# Patient Record
Sex: Female | Born: 1990 | Race: White | Hispanic: No | Marital: Single | Smoking: Never smoker
Health system: Southern US, Community
[De-identification: ages and names within clinical notes are randomized; demographics above are authoritative.]

## PROBLEM LIST (undated history)

## (undated) DIAGNOSIS — F502 Bulimia nervosa, unspecified: Secondary | ICD-10-CM

## (undated) DIAGNOSIS — N83201 Unspecified ovarian cyst, right side: Secondary | ICD-10-CM

## (undated) DIAGNOSIS — K5900 Constipation, unspecified: Secondary | ICD-10-CM

## (undated) DIAGNOSIS — N83202 Unspecified ovarian cyst, left side: Secondary | ICD-10-CM

## (undated) DIAGNOSIS — R630 Anorexia: Secondary | ICD-10-CM

## (undated) HISTORY — PX: WISDOM TOOTH EXTRACTION: SHX21

---

## 2012-01-11 ENCOUNTER — Emergency Department (HOSPITAL_COMMUNITY)
Admission: EM | Admit: 2012-01-11 | Discharge: 2012-01-12 | Disposition: A | Discharge status: Home / Self Care | Payer: BC Managed Care – PPO | Attending: Emergency Medicine | Admitting: Emergency Medicine

## 2012-01-11 ENCOUNTER — Encounter (HOSPITAL_COMMUNITY): Payer: Self-pay

## 2012-01-11 DIAGNOSIS — N920 Excessive and frequent menstruation with regular cycle: Secondary | ICD-10-CM | POA: Insufficient documentation

## 2012-01-11 DIAGNOSIS — Z881 Allergy status to other antibiotic agents status: Secondary | ICD-10-CM | POA: Insufficient documentation

## 2012-01-11 HISTORY — DX: Bulimia nervosa: F50.2

## 2012-01-11 HISTORY — DX: Unspecified ovarian cyst, right side: N83.201

## 2012-01-11 HISTORY — DX: Unspecified ovarian cyst, left side: N83.202

## 2012-01-11 HISTORY — DX: Anorexia: R63.0

## 2012-01-11 HISTORY — DX: Constipation, unspecified: K59.00

## 2012-01-11 HISTORY — DX: Bulimia nervosa, unspecified: F50.20

## 2012-01-11 LAB — COMPREHENSIVE METABOLIC PANEL
ALT: 12 U/L (ref 0–35)
BUN: 14 mg/dL (ref 6–23)
CO2: 27 mEq/L (ref 19–32)
Calcium: 9.4 mg/dL (ref 8.4–10.5)
Creatinine, Ser: 0.77 mg/dL (ref 0.50–1.10)
GFR calc Af Amer: >90 mL/min (ref >90)
GFR calc non Af Amer: >90 mL/min (ref >90)
Glucose, Bld: 101 mg/dL — ABNORMAL HIGH (ref 70–99)
Sodium: 139 mEq/L (ref 135–145)
Total Protein: 6.6 g/dL (ref 6.0–8.3)

## 2012-01-11 LAB — CBC WITH DIFFERENTIAL/PLATELET
Eosinophils Absolute: 0.1 10*3/uL (ref 0.0–0.7)
Eosinophils Relative: 2 % (ref 0–5)
HCT: 38.3 % (ref 36.0–46.0)
Lymphocytes Relative: 28 % (ref 12–46)
Lymphs Abs: 2.4 10*3/uL (ref 0.7–4.0)
MCH: 29.6 pg (ref 26.0–34.0)
MCV: 86.7 fL (ref 78.0–100.0)
Monocytes Absolute: 0.8 10*3/uL (ref 0.1–1.0)
Monocytes Relative: 9 % (ref 3–12)
Platelets: 314 10*3/uL (ref 150–400)
RBC: 4.42 MIL/uL (ref 3.87–5.11)
WBC: 8.4 10*3/uL (ref 4.0–10.5)

## 2012-01-11 LAB — URINALYSIS, ROUTINE W REFLEX MICROSCOPIC
Bilirubin Urine: NEGATIVE
Leukocytes, UA: NEGATIVE
Nitrite: NEGATIVE
Specific Gravity, Urine: 1.023 (ref 1.005–1.030)
Urobilinogen, UA: 1 mg/dL (ref 0.0–1.0)
pH: 7 (ref 5.0–8.0)

## 2012-01-11 LAB — WET PREP, GENITAL: Yeast Wet Prep HPF POC: NONE SEEN

## 2012-01-11 NOTE — ED Notes (Signed)
Pt presents with NAD- vaginal bleeding x 2 days LMP 2 weeks ago- denies urinary symptoms, N/V/D and fever

## 2012-01-11 NOTE — ED Provider Notes (Signed)
History     CSN: [id number]  Arrival date & time 01/11/12  2122   First MD Initiated Contact with Patient 01/11/12 2217      Chief Complaint  Patient presents with  . Vaginal Bleeding  . Abdominal Pain    (Consider location/radiation/quality/duration/timing/severity/associated sxs/prior treatment) HPI Comments: Patient with history of ovarian cysts presents with vaginal bleeding of 3 days duration. The bleeding is heavy, soaking through a super tampon every 1-2 hours. She has associated cramps and sharp pains on the LLQ of the abdomen. It radiates to her back. Progression is slowly worsening. She has taken diclofenac to relieve the pain which has worked. This afternoon after work she felt light-headed and nauseated. Denies fever, vomiting, diarrhea, dysuria, or vaginal discharge. LMP was 2 weeks ago. Her normal cycle is every 28 days. She had unprotected sex 2 weeks ago.   Patient is a 21 y.o. female presenting with vaginal bleeding and abdominal pain. The history is provided by the patient.  Vaginal Bleeding Associated symptoms include abdominal pain and nausea. Pertinent negatives include no congestion, coughing, diaphoresis, fever, headaches, myalgias, neck pain or vomiting.  Abdominal Pain The primary symptoms of the illness include abdominal pain, nausea and vaginal bleeding. The primary symptoms of the illness do not include fever, vomiting, diarrhea, dysuria or vaginal discharge.  Symptoms associated with the illness do not include diaphoresis.    Past Medical History  Diagnosis Date  . Bilateral ovarian cysts   . Constipation   . Anorexia   . Bulimia     Past Surgical History  Procedure Date  . Wisdom tooth extraction     No family history on file.  History  Substance Use Topics  . Smoking status: Never Smoker   . Smokeless tobacco: Not on file  . Alcohol Use: Yes    OB History    Grav Para Term Preterm Abortions TAB SAB Ect Mult Living                   Review of Systems  Constitutional: Negative for fever, diaphoresis and activity change.  HENT: Negative for congestion and neck pain.   Respiratory: Negative for cough.   Gastrointestinal: Positive for nausea and abdominal pain. Negative for vomiting and diarrhea.  Genitourinary: Positive for vaginal bleeding. Negative for dysuria, flank pain and vaginal discharge.  Musculoskeletal: Negative for myalgias.  Skin: Negative for color change and wound.  Neurological: Positive for light-headedness. Negative for headaches.  All other systems reviewed and are negative.    Allergies  Minocycline  Home Medications   Current Outpatient Rx  Name Route Sig Dispense Refill  . ALPRAZOLAM 0.5 MG PO TABS Oral Take 0.5 mg by mouth at bedtime as needed. Anxiety    . DICLOFENAC SODIUM 25 MG PO TBEC Oral Take 25 mg by mouth as needed. Anxiety    . ESCITALOPRAM OXALATE 20 MG PO TABS Oral Take 20 mg by mouth daily.    Kathrynn Running ESTRADIOL-FE 0.8-25 MG-MCG PO CHEW Oral Chew 1 tablet by mouth daily.      BP 116/76  Pulse 79  Temp 97.6 F (36.4 C) (Oral)  Resp 23  Ht 5\' 9"  (1.753 m)  Wt 134 lb (60.782 kg)  BMI 19.79 kg/m2  SpO2 98%  LMP 12/24/2011  Physical Exam  Nursing note and vitals reviewed. Constitutional: She is oriented to person, place, and time. She appears well-developed and well-nourished. No distress.       Sitting comfortably and laughing with friends  HENT:  Head: Normocephalic and atraumatic.  Eyes: Conjunctivae normal and EOM are normal.  Neck: Normal range of motion.  Pulmonary/Chest: Effort normal.  Abdominal: Soft. Bowel sounds are normal.       Nl bowel sounds. Abdomen soft with mild tenderness to palpation over the LLQ. No peritoneal signs.  Genitourinary:       No CMT. Mild left adnexal tenderness, no mass. No right adnexal tenderness. Blood seen in vaginal vault. No vaginal discharge. Cervical os closed.   Musculoskeletal: Normal range of motion.   Neurological: She is alert and oriented to person, place, and time.  Skin: Skin is warm and dry. No rash noted. She is not diaphoretic.  Psychiatric: She has a normal mood and affect. Her behavior is normal.    ED Course  Procedures (including critical care time)   Labs Reviewed  CBC WITH DIFFERENTIAL  COMPREHENSIVE METABOLIC PANEL  URINALYSIS, ROUTINE W REFLEX MICROSCOPIC  PREGNANCY, URINE   No results found.   No diagnosis found.    MDM  Menorrhagia 21 year old female presents emergency department complaining of heavy vaginal bleeding.  Labs reviewed and no evidence of anemia found.  Pelvic exam non-concerning for PID or ovarian torsion.  Patient has been established OB/GYN doctor that she can followup with tomorrow morning.  Patient appears reliable for followup.` At this time there does not appear to be any evidence of an acute emergency medical condition and the patient appears stable for discharge with appropriate outpatient follow up. Diagnosis was discussed with patient who verbalizes understanding and is agreeable to discharge.         Jaci Carrel, [place] 01/12/12 0005

## 2012-01-12 LAB — GC/CHLAMYDIA PROBE AMP, GENITAL
Chlamydia, DNA Probe: NEGATIVE
GC Probe Amp, Genital: NEGATIVE

## 2012-01-12 MED ORDER — HYDROCODONE-ACETAMINOPHEN 5-325 MG PO TABS: 1.0000 | ORAL_TABLET | Freq: Once | ORAL | Status: DC

## 2012-01-12 NOTE — ED Provider Notes (Signed)
Medical screening examination/treatment/procedure(s) were performed by non-physician practitioner and as supervising physician I was immediately available for consultation/collaboration.  Danniel Tones M Bryleigh Ottaway, MD 01/12/12 0550 

## 2013-07-05 ENCOUNTER — Emergency Department (HOSPITAL_COMMUNITY): Payer: BC Managed Care – PPO

## 2013-07-05 ENCOUNTER — Encounter (HOSPITAL_COMMUNITY): Payer: Self-pay | Admitting: Emergency Medicine

## 2013-07-05 ENCOUNTER — Emergency Department (HOSPITAL_COMMUNITY)
Admission: EM | Admit: 2013-07-05 | Discharge: 2013-07-05 | Disposition: A | Discharge status: Home / Self Care | Payer: BC Managed Care – PPO | Attending: Emergency Medicine | Admitting: Emergency Medicine

## 2013-07-05 DIAGNOSIS — Z8659 Personal history of other mental and behavioral disorders: Secondary | ICD-10-CM | POA: Insufficient documentation

## 2013-07-05 DIAGNOSIS — N949 Unspecified condition associated with female genital organs and menstrual cycle: Secondary | ICD-10-CM | POA: Insufficient documentation

## 2013-07-05 DIAGNOSIS — Z79899 Other long term (current) drug therapy: Secondary | ICD-10-CM | POA: Insufficient documentation

## 2013-07-05 DIAGNOSIS — R1110 Vomiting, unspecified: Secondary | ICD-10-CM | POA: Insufficient documentation

## 2013-07-05 DIAGNOSIS — Z8719 Personal history of other diseases of the digestive system: Secondary | ICD-10-CM | POA: Insufficient documentation

## 2013-07-05 DIAGNOSIS — R102 Pelvic and perineal pain: Secondary | ICD-10-CM

## 2013-07-05 DIAGNOSIS — Z3202 Encounter for pregnancy test, result negative: Secondary | ICD-10-CM | POA: Insufficient documentation

## 2013-07-05 LAB — COMPREHENSIVE METABOLIC PANEL
ALBUMIN: 3.6 g/dL (ref 3.5–5.2)
ALT: 10 U/L (ref 0–35)
AST: 16 U/L (ref 0–37)
Alkaline Phosphatase: 61 U/L (ref 39–117)
BUN: 9 mg/dL (ref 6–23)
CALCIUM: 9.5 mg/dL (ref 8.4–10.5)
CO2: 24 meq/L (ref 19–32)
Chloride: 99 mEq/L (ref 96–112)
Creatinine, Ser: 0.81 mg/dL (ref 0.50–1.10)
GFR calc Af Amer: >90 mL/min (ref >90)
GFR calc non Af Amer: >90 mL/min (ref >90)
Glucose, Bld: 120 mg/dL — ABNORMAL HIGH (ref 70–99)
Potassium: 3.4 mEq/L — ABNORMAL LOW (ref 3.7–5.3)
SODIUM: 138 meq/L (ref 137–147)
TOTAL PROTEIN: 7.2 g/dL (ref 6.0–8.3)
Total Bilirubin: 0.4 mg/dL (ref 0.3–1.2)

## 2013-07-05 LAB — URINALYSIS, ROUTINE W REFLEX MICROSCOPIC
Bilirubin Urine: NEGATIVE
GLUCOSE, UA: NEGATIVE mg/dL
Hgb urine dipstick: NEGATIVE
Ketones, ur: NEGATIVE mg/dL
LEUKOCYTES UA: NEGATIVE
NITRITE: NEGATIVE
PH: 6 (ref 5.0–8.0)
Protein, ur: NEGATIVE mg/dL
Specific Gravity, Urine: 1.023 (ref 1.005–1.030)
Urobilinogen, UA: 0.2 mg/dL (ref 0.0–1.0)

## 2013-07-05 LAB — CBC WITH DIFFERENTIAL/PLATELET
BASOS ABS: 0 10*3/uL (ref 0.0–0.1)
BASOS PCT: 0 % (ref 0–1)
EOS ABS: 0 10*3/uL (ref 0.0–0.7)
EOS PCT: 0 % (ref 0–5)
HCT: 40.4 % (ref 36.0–46.0)
Hemoglobin: 13.7 g/dL (ref 12.0–15.0)
LYMPHS PCT: 4 % — AB (ref 12–46)
Lymphs Abs: 0.6 10*3/uL — ABNORMAL LOW (ref 0.7–4.0)
MCH: 29.7 pg (ref 26.0–34.0)
MCHC: 33.9 g/dL (ref 30.0–36.0)
MCV: 87.6 fL (ref 78.0–100.0)
Monocytes Absolute: 0.9 10*3/uL (ref 0.1–1.0)
Monocytes Relative: 6 % (ref 3–12)
Neutro Abs: 14.7 10*3/uL — ABNORMAL HIGH (ref 1.7–7.7)
Neutrophils Relative %: 91 % — ABNORMAL HIGH (ref 43–77)
PLATELETS: 310 10*3/uL (ref 150–400)
RBC: 4.61 MIL/uL (ref 3.87–5.11)
RDW: 12.6 % (ref 11.5–15.5)
WBC: 16.2 10*3/uL — AB (ref 4.0–10.5)

## 2013-07-05 LAB — WET PREP, GENITAL
Clue Cells Wet Prep HPF POC: NONE SEEN
Trich, Wet Prep: NONE SEEN
Yeast Wet Prep HPF POC: NONE SEEN

## 2013-07-05 LAB — LIPASE, BLOOD: Lipase: 20 U/L (ref 11–59)

## 2013-07-05 LAB — POC URINE PREG, ED: PREG TEST UR: NEGATIVE

## 2013-07-05 MED ORDER — SODIUM CHLORIDE 0.9 % IV BOLUS (SEPSIS)
2000.0000 mL | Freq: Once | INTRAVENOUS | Status: AC
  Administered 2013-07-05: 2000 mL via INTRAVENOUS

## 2013-07-05 MED ORDER — CLINDAMYCIN HCL 300 MG PO CAPS: 300.0000 mg | ORAL_CAPSULE | Freq: Three times a day (TID) | ORAL | Status: AC

## 2013-07-05 MED ORDER — HYDROCODONE-ACETAMINOPHEN 5-325 MG PO TABS: 1.0000 | ORAL_TABLET | Freq: Four times a day (QID) | ORAL | Status: AC | PRN

## 2013-07-05 MED ORDER — SODIUM CHLORIDE 0.9 % IV SOLN
Freq: Once | INTRAVENOUS | Status: AC
  Administered 2013-07-05: 14:00:00 via INTRAVENOUS

## 2013-07-05 MED ORDER — MORPHINE SULFATE 4 MG/ML IJ SOLN
6.0000 mg | Freq: Once | INTRAMUSCULAR | Status: AC
  Administered 2013-07-05: 6 mg via INTRAVENOUS
  Filled 2013-07-05: qty 2

## 2013-07-05 MED ORDER — ONDANSETRON HCL 4 MG/2ML IJ SOLN
4.0000 mg | Freq: Once | INTRAMUSCULAR | Status: AC
Start: 2013-07-05 — End: 2013-07-05
  Administered 2013-07-05: 4 mg via INTRAVENOUS
  Filled 2013-07-05: qty 2

## 2013-07-05 MED ORDER — IBUPROFEN 800 MG PO TABS
800.0000 mg | ORAL_TABLET | Freq: Once | ORAL | Status: AC
  Administered 2013-07-05: 800 mg via ORAL
  Filled 2013-07-05: qty 1

## 2013-07-05 MED ORDER — IBUPROFEN 800 MG PO TABS: 800.0000 mg | ORAL_TABLET | Freq: Three times a day (TID) | ORAL | Status: AC | PRN

## 2013-07-05 MED ORDER — IOHEXOL 300 MG/ML  SOLN
50.0000 mL | Freq: Once | INTRAMUSCULAR | Status: AC | PRN
  Administered 2013-07-05: 50 mL via ORAL

## 2013-07-05 MED ORDER — DEXTROSE 5 % IV SOLN
1.0000 g | Freq: Once | INTRAVENOUS | Status: AC
  Administered 2013-07-05: 1 g via INTRAVENOUS
  Filled 2013-07-05: qty 10

## 2013-07-05 MED ORDER — IOHEXOL 300 MG/ML  SOLN
80.0000 mL | Freq: Once | INTRAMUSCULAR | Status: AC | PRN
  Administered 2013-07-05: 80 mL via INTRAVENOUS

## 2013-07-05 NOTE — ED Provider Notes (Signed)
Medical screening examination/treatment/procedure(s) were conducted as a shared visit with non-physician practitioner(s) and myself.  I personally evaluated the patient during the encounter.   EKG Interpretation None      Patient with tenderness in her lower abdomen.  CT scan demonstrates normal appendix.  Possible ruptured cyst.  Doubt portion.  Patient with fever 101.9 with some tenderness on pelvic exam per my physician assistant.  Patiently treated for suspected pelvic inflammatory disease.  Given her fever and her degree of tenderness she will return the ER tomorrow for recheck.  Ct Abdomen Pelvis W Contrast  07/05/2013   CLINICAL DATA:  Abdominal pain.  Vomiting  EXAM: CT ABDOMEN AND PELVIS WITH CONTRAST  TECHNIQUE: Multidetector CT imaging of the abdomen and pelvis was performed using the standard protocol following bolus administration of intravenous contrast.  CONTRAST:  80mL OMNIPAQUE IOHEXOL 300 MG/ML  SOLN  COMPARISON:  None.  FINDINGS: Lung bases are clear. Liver gallbladder and bile ducts are normal. Pancreas and spleen are normal. The kidneys show no obstruction mass or stone. Urinary bladder is normal.  Moderate constipation with stool throughout the colon. No bowel obstruction. Appendix is normal.  Mild amount of free fluid in the pelvis. This may be due to ovarian cyst rupture. No significant cyst is seen at this time.  IMPRESSION: Constipation without bowel obstruction  Normal appendix  Free fluid in the pelvis, question recently ruptured ovarian cyst.   Electronically Signed   By: Marlan Palauharles  Clark M.D.   On: 07/05/2013 15:56  I personally reviewed the imaging tests through PACS system I reviewed available ER/hospitalization records through the EMR  Results for orders placed during the hospital encounter of 07/05/13  CBC WITH DIFFERENTIAL      Result Value Ref Range   WBC 16.2 (*) 4.0 - 10.5 K/uL   RBC 4.61  3.87 - 5.11 MIL/uL   Hemoglobin 13.7  12.0 - 15.0 g/dL   HCT 40.940.4  81.136.0 -  91.446.0 %   MCV 87.6  78.0 - 100.0 fL   MCH 29.7  26.0 - 34.0 pg   MCHC 33.9  30.0 - 36.0 g/dL   RDW 78.212.6  95.611.5 - 21.315.5 %   Platelets 310  150 - 400 K/uL   Neutrophils Relative % 91 (*) 43 - 77 %   Neutro Abs 14.7 (*) 1.7 - 7.7 K/uL   Lymphocytes Relative 4 (*) 12 - 46 %   Lymphs Abs 0.6 (*) 0.7 - 4.0 K/uL   Monocytes Relative 6  3 - 12 %   Monocytes Absolute 0.9  0.1 - 1.0 K/uL   Eosinophils Relative 0  0 - 5 %   Eosinophils Absolute 0.0  0.0 - 0.7 K/uL   Basophils Relative 0  0 - 1 %   Basophils Absolute 0.0  0.0 - 0.1 K/uL  COMPREHENSIVE METABOLIC PANEL      Result Value Ref Range   Sodium 138  137 - 147 mEq/L   Potassium 3.4 (*) 3.7 - 5.3 mEq/L   Chloride 99  96 - 112 mEq/L   CO2 24  19 - 32 mEq/L   Glucose, Bld 120 (*) 70 - 99 mg/dL   BUN 9  6 - 23 mg/dL   Creatinine, Ser 0.860.81  0.50 - 1.10 mg/dL   Calcium 9.5  8.4 - 57.810.5 mg/dL   Total Protein 7.2  6.0 - 8.3 g/dL   Albumin 3.6  3.5 - 5.2 g/dL   AST 16  0 - 37 U/L  ALT 10  0 - 35 U/L   Alkaline Phosphatase 61  39 - 117 U/L   Total Bilirubin 0.4  0.3 - 1.2 mg/dL   GFR calc non Af Amer >90  >90 mL/min   GFR calc Af Amer >90  >90 mL/min  LIPASE, BLOOD      Result Value Ref Range   Lipase 20  11 - 59 U/L  URINALYSIS, ROUTINE W REFLEX MICROSCOPIC      Result Value Ref Range   Color, Urine YELLOW  YELLOW   APPearance CLEAR  CLEAR   Specific Gravity, Urine 1.023  1.005 - 1.030   pH 6.0  5.0 - 8.0   Glucose, UA NEGATIVE  NEGATIVE mg/dL   Hgb urine dipstick NEGATIVE  NEGATIVE   Bilirubin Urine NEGATIVE  NEGATIVE   Ketones, ur NEGATIVE  NEGATIVE mg/dL   Protein, ur NEGATIVE  NEGATIVE mg/dL   Urobilinogen, UA 0.2  0.0 - 1.0 mg/dL   Nitrite NEGATIVE  NEGATIVE   Leukocytes, UA NEGATIVE  NEGATIVE  POC URINE PREG, ED      Result Value Ref Range   Preg Test, Ur NEGATIVE  NEGATIVE     Lyanne Co, MD 07/05/13 1640

## 2013-07-05 NOTE — ED Provider Notes (Signed)
CSN: [id number]     Arrival date & time 07/05/13  1143 History   First MD Initiated Contact with Patient 07/05/13 1214     Chief Complaint  Patient presents with  . Abdominal Pain     (Consider location/radiation/quality/duration/timing/severity/associated sxs/prior Treatment) HPI Patient presents emergency department with abdominal pain, started last night. Patient, states she's vomited x5.  Patient, states she did not take any medications prior to arrival.  The patient denies diarrhea, weakness, dizziness, headache, blurred vision, chest pain, shortness of breath, back pain, dysuria, rash, fever, or syncope she states that nothing seems make her condition, better, but palpation makes her pain, worse. Past Medical History  Diagnosis Date  . Bilateral ovarian cysts   . Constipation   . Anorexia   . Bulimia    Past Surgical History  Procedure Laterality Date  . Wisdom tooth extraction     History reviewed. No pertinent family history. History  Substance Use Topics  . Smoking status: Never Smoker   . Smokeless tobacco: Not on file  . Alcohol Use: Yes   OB History   Grav Para Term Preterm Abortions TAB SAB Ect Mult Living                 Review of Systems  All other systems negative except as documented in the HPI. All pertinent positives and negatives as reviewed in the HPI.  Allergies  Minocycline  Home Medications   Current Outpatient Rx  Name  Route  Sig  Dispense  Refill  . clonazePAM (KLONOPIN) 0.5 MG tablet   Oral   Take 0.5 mg by mouth as needed for anxiety.         Marland Kitchen. FLUoxetine (PROZAC) 20 MG capsule   Oral   Take 20 mg by mouth daily.         Marland Kitchen. ibuprofen (ADVIL,MOTRIN) 200 MG tablet   Oral   Take 400 mg by mouth every 6 (six) hours as needed for mild pain or cramping.         . norethindrone-ethinyl estradiol (BALZIVA) 0.4-35 MG-MCG tablet   Oral   Take 1 tablet by mouth daily.          BP 96/47  Pulse 96  Temp(Src) 99.2 F (37.3 C)  (Oral)  Resp 16  SpO2 96%  LMP 06/07/2013 Physical Exam  Nursing note and vitals reviewed. Constitutional: She is oriented to person, place, and time. She appears well-developed and well-nourished. No distress.  HENT:  Head: Normocephalic and atraumatic.  Mouth/Throat: Oropharynx is clear and moist.  Eyes: Pupils are equal, round, and reactive to light.  Neck: Normal range of motion. Neck supple.  Cardiovascular: Normal rate, regular rhythm and normal heart sounds.  Exam reveals no gallop and no friction rub.   No murmur heard. Pulmonary/Chest: Effort normal and breath sounds normal. No respiratory distress.  Abdominal: Soft. Bowel sounds are normal. She exhibits no distension. There is tenderness. There is no rebound and no guarding.    Genitourinary: Cervix exhibits no motion tenderness, no discharge and no friability. Right adnexum displays no tenderness and no fullness. Left adnexum displays tenderness. Left adnexum displays no fullness.  Neurological: She is alert and oriented to person, place, and time. She exhibits normal muscle tone. Coordination normal.  Skin: Skin is warm and dry.    ED Course  Procedures (including critical care time) Labs Review Labs Reviewed  CBC WITH DIFFERENTIAL - Abnormal; Notable for the following:    WBC 16.2 (*)  Neutrophils Relative % 91 (*)    Neutro Abs 14.7 (*)    Lymphocytes Relative 4 (*)    Lymphs Abs 0.6 (*)    All other components within normal limits  COMPREHENSIVE METABOLIC PANEL - Abnormal; Notable for the following:    Potassium 3.4 (*)    Glucose, Bld 120 (*)    All other components within normal limits  LIPASE, BLOOD  URINALYSIS, ROUTINE W REFLEX MICROSCOPIC  POC URINE PREG, ED   Imaging Review Ct Abdomen Pelvis W Contrast  07/05/2013   CLINICAL DATA:  Abdominal pain.  Vomiting  EXAM: CT ABDOMEN AND PELVIS WITH CONTRAST  TECHNIQUE: Multidetector CT imaging of the abdomen and pelvis was performed using the standard  protocol following bolus administration of intravenous contrast.  CONTRAST:  80mL OMNIPAQUE IOHEXOL 300 MG/ML  SOLN  COMPARISON:  None.  FINDINGS: Lung bases are clear. Liver gallbladder and bile ducts are normal. Pancreas and spleen are normal. The kidneys show no obstruction mass or stone. Urinary bladder is normal.  Moderate constipation with stool throughout the colon. No bowel obstruction. Appendix is normal.  Mild amount of free fluid in the pelvis. This may be due to ovarian cyst rupture. No significant cyst is seen at this time.  IMPRESSION: Constipation without bowel obstruction  Normal appendix  Free fluid in the pelvis, question recently ruptured ovarian cyst.   Electronically Signed   By: Marlan Palau M.D.   On: 07/05/2013 15:56      Patient is a CT scan that shows free pelvic fluid.  Could represent a ruptured cyst.  The patient has had cysts in the past.  She said it felt similar to that type of pain.  She has been rechecked and her pain that is vastly improved following pain medications.  Patient be treated for possible presumptive pelvic infection, based on her exquisite tenderness on adnexal exam, and fever.  The patient may have had a ruptured cyst noted on CT scan with the patient.  Return here in 24 hours for recheck  Carlyle Dolly, PA-C 07/05/13 1638  Carlyle Dolly, PA-C 07/05/13 [phone]

## 2013-07-05 NOTE — ED Notes (Signed)
Pt reports abdominal pain starting last night, also painful urination. Pain 9/10. Has vomited x5 in last 24 hours. Reports nausea. Denies diarrhea. Last bowel movement a few days ago which is normal.

## 2013-07-05 NOTE — Discharge Instructions (Signed)
Return here in 24 hours for a recheck or sooner if your condition worsens.  This could be an evolving process that is yet to declare itself.  Increase your fluid intake, rest as much possible

## 2013-07-06 LAB — GC/CHLAMYDIA PROBE AMP
CT Probe RNA: NEGATIVE
GC Probe RNA: NEGATIVE

## 2015-08-22 IMAGING — CT CT ABD-PELV W/ CM
1 of 2 series · 16 of 32 positions shown, 20 images · IV contrast (OMNIPAQUE 300)
Comparison: None.

CLINICAL DATA: Abdominal pain.  Vomiting

EXAM:
CT ABDOMEN AND PELVIS WITH CONTRAST
TECHNIQUE: Multidetector CT imaging of the abdomen and pelvis was performed
using the standard protocol following bolus administration of
intravenous contrast.
CONTRAST:  80mL OMNIPAQUE IOHEXOL 300 MG/ML  SOLN

[Series 2: abd/pel with · axial · 0.69mm/px · z∈[-617,-162]mm · 16 of 99 slices shown, 20 images]
[im 4/99  soft-tissue]
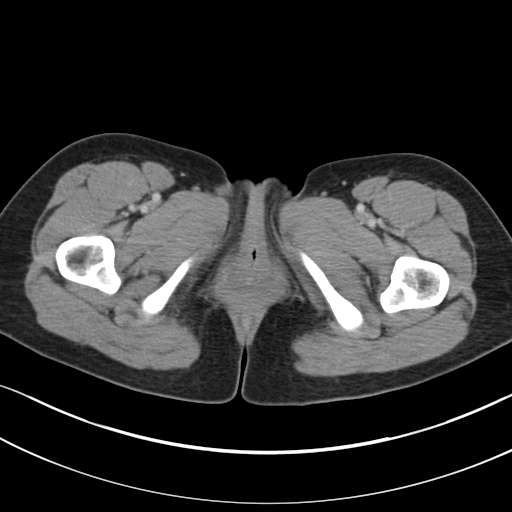
[im 4/99  bone]
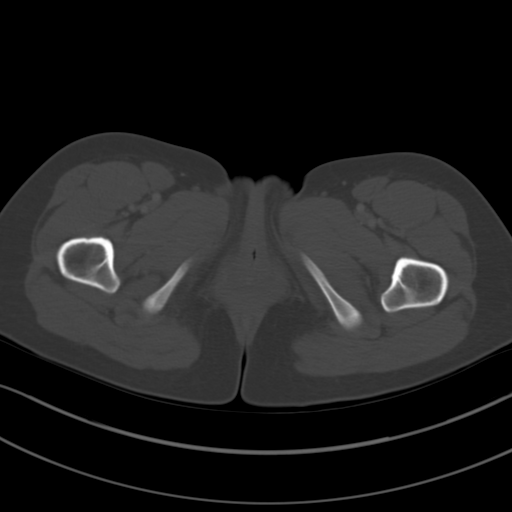
[im 11/99  soft-tissue]
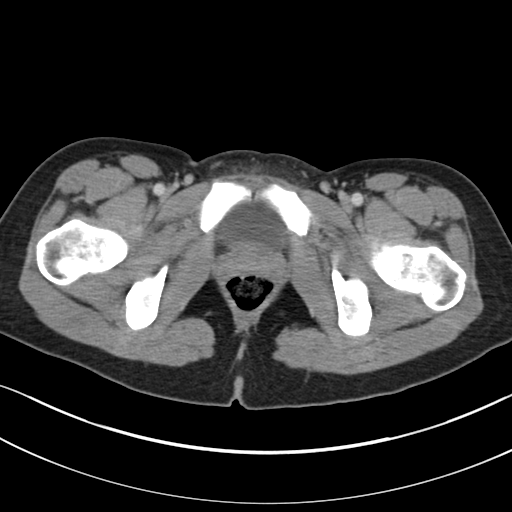
[im 19/99  soft-tissue]
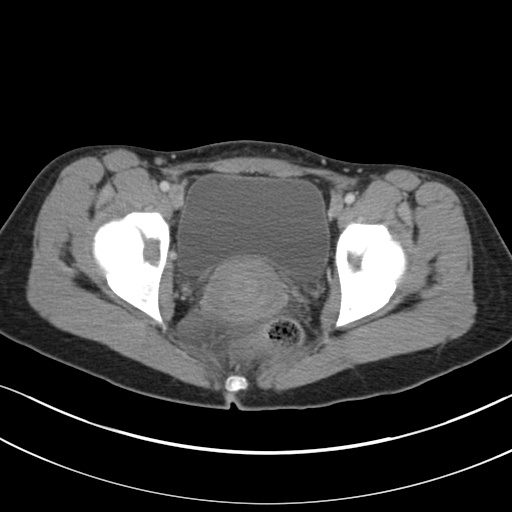
[im 26/99  soft-tissue]
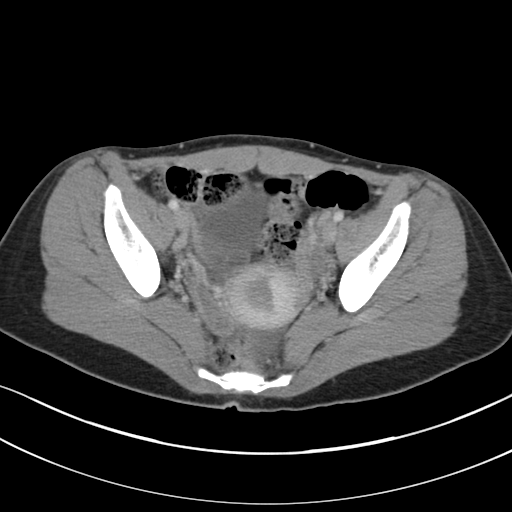
[im 33/99  soft-tissue]
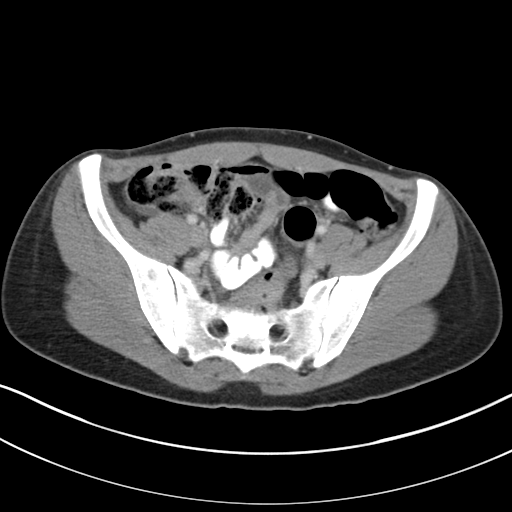
[im 40/99  soft-tissue]
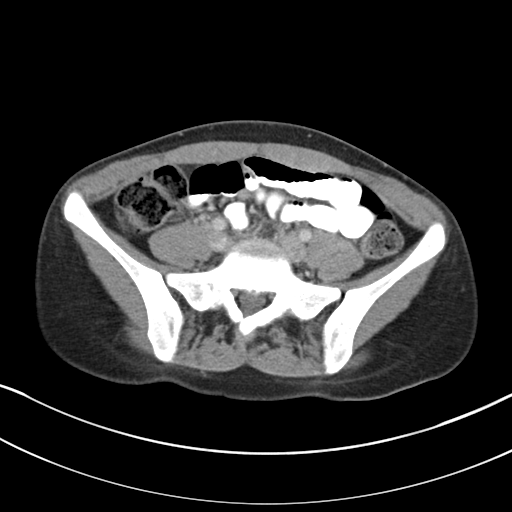
[im 48/99  soft-tissue]
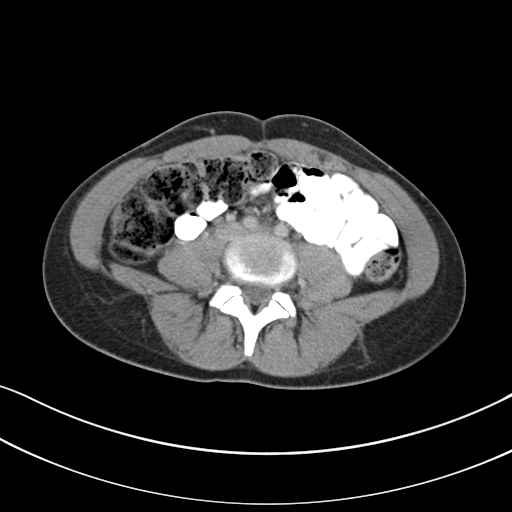
[im 51/99  soft-tissue]
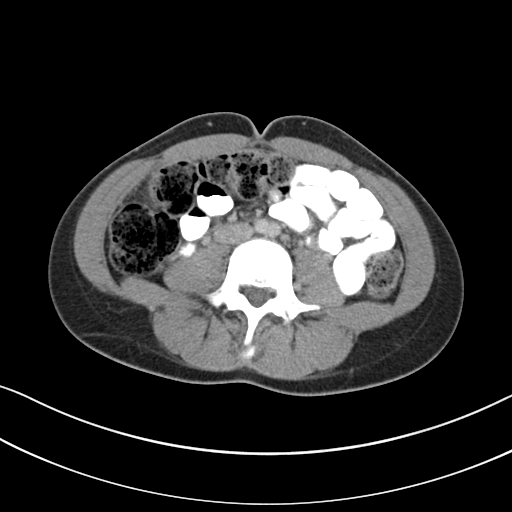
[im 59/99  soft-tissue]
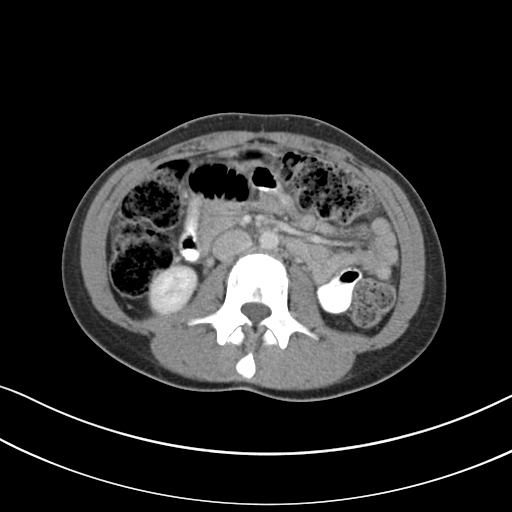
[im 59/99  bone]
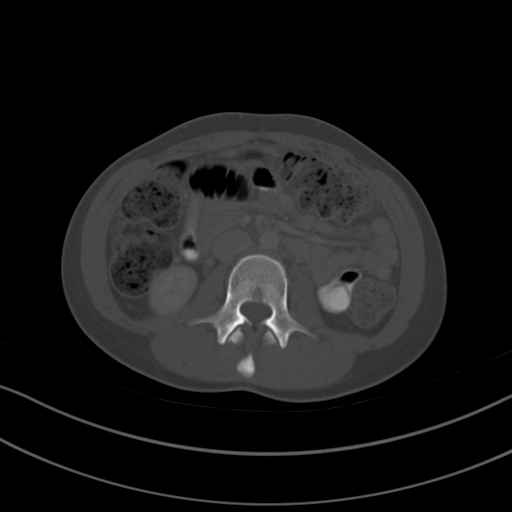
[im 66/99  soft-tissue]
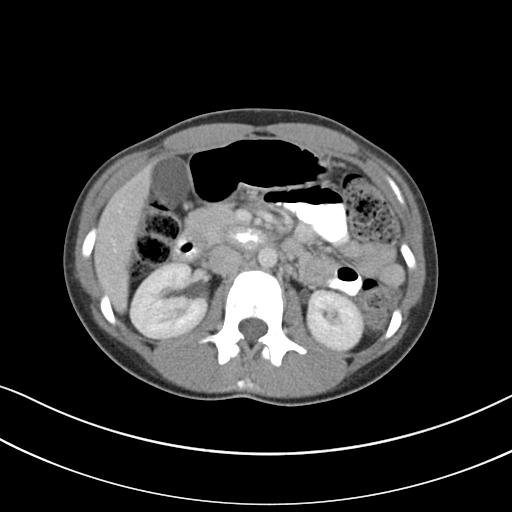
[im 73/99  soft-tissue]
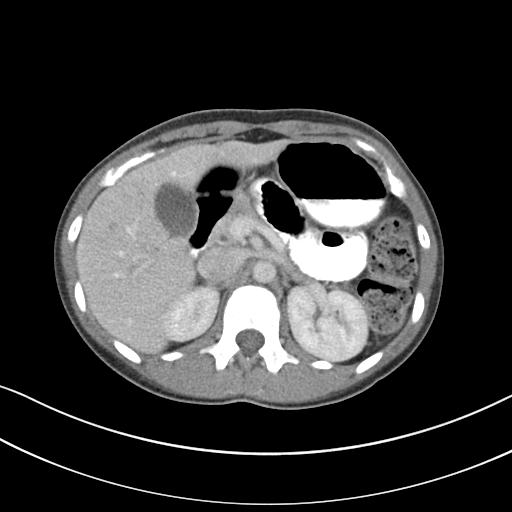
[im 80/99  soft-tissue]
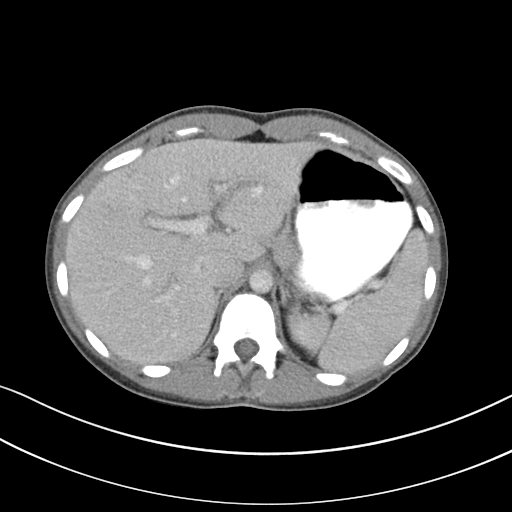
[im 84/99  lung]
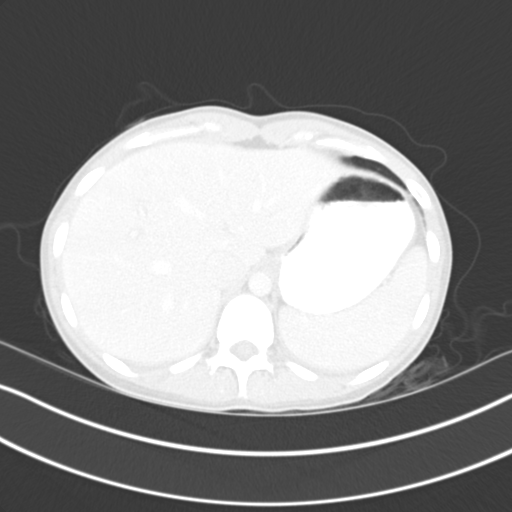
[im 88/99  soft-tissue]
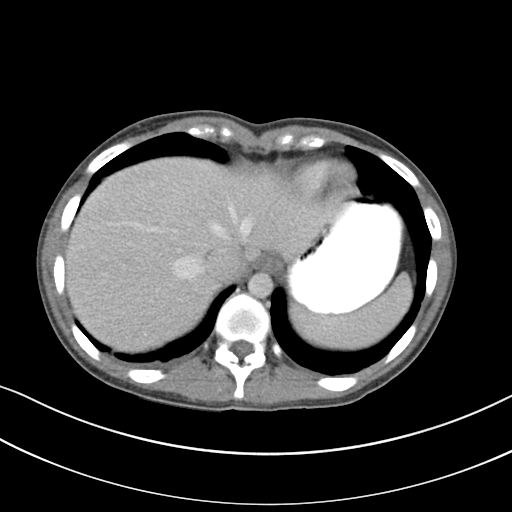
[im 88/99  lung]
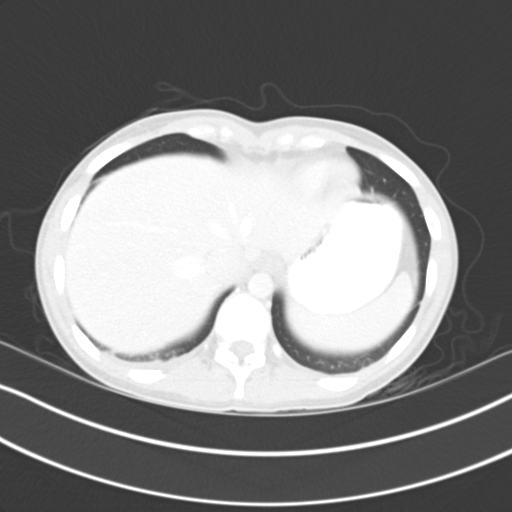
[im 91/99  lung]
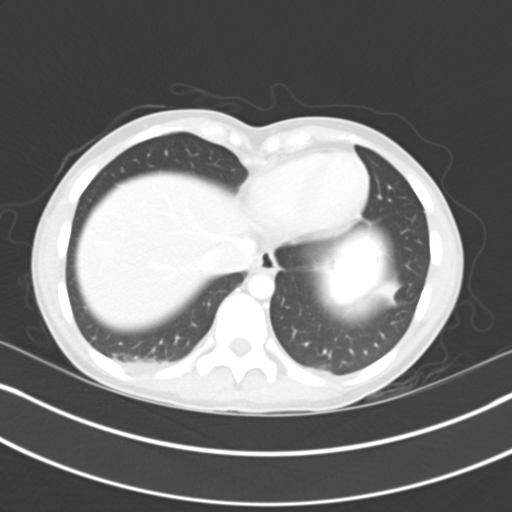
[im 95/99  soft-tissue]
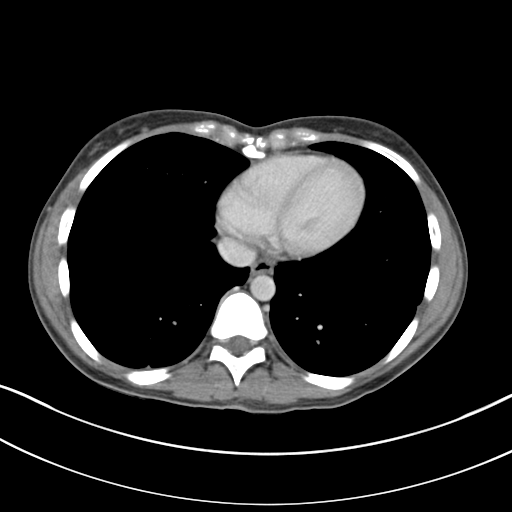
[im 95/99  lung]
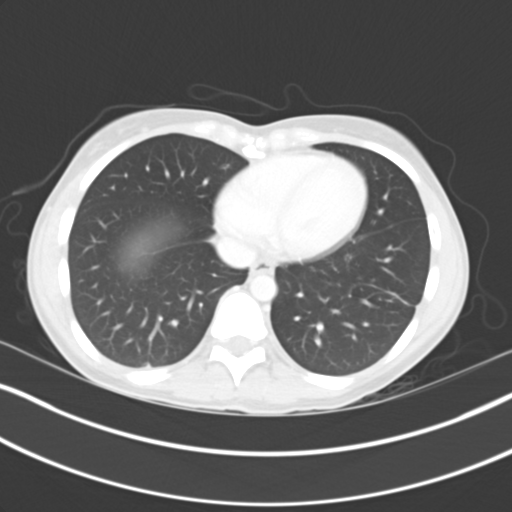

[16 of 32 positions shown; findings below may reference images not displayed]

FINDINGS: Lung bases are clear. Liver gallbladder and bile ducts are normal.
Pancreas and spleen are normal. The kidneys show no obstruction mass
or stone. Urinary bladder is normal.

Moderate constipation with stool throughout the colon. No bowel
obstruction. Appendix is normal.

Mild amount of free fluid in the pelvis. This may be due to ovarian
cyst rupture. No significant cyst is seen at this time.
IMPRESSION: Constipation without bowel obstruction

Normal appendix

Free fluid in the pelvis, question recently ruptured ovarian cyst.

## 2020-09-05 ENCOUNTER — Ambulatory Visit: Payer: BC Managed Care – PPO | Admitting: Gastroenterology
